# Patient Record
Sex: Female | Born: 1990
Health system: Southern US, Community
[De-identification: ages and names within clinical notes are randomized; demographics above are authoritative.]

## PROBLEM LIST (undated history)

## (undated) HISTORY — PX: WISDOM TOOTH EXTRACTION: SHX21

---

## 2013-09-16 ENCOUNTER — Ambulatory Visit (INDEPENDENT_AMBULATORY_CARE_PROVIDER_SITE_OTHER): Payer: 59

## 2013-09-16 VITALS — BP 110/69 | HR 96 | Resp 16 | Ht 66.0 in | Wt 145.0 lb

## 2013-09-16 DIAGNOSIS — L600 Ingrowing nail: Secondary | ICD-10-CM

## 2013-09-16 DIAGNOSIS — L03039 Cellulitis of unspecified toe: Secondary | ICD-10-CM

## 2013-09-16 MED ORDER — CEPHALEXIN 500 MG PO CAPS: 500.0000 mg | ORAL_CAPSULE | Freq: Three times a day (TID) | ORAL | Status: DC

## 2013-09-16 NOTE — Patient Instructions (Addendum)

## 2013-09-16 NOTE — Progress Notes (Signed)
   Subjective:    Patient ID: Katie Craig, female    DOB: Oct 25, 1990, 23 y.o.   MRN: [id number]  HPI Comments: "I have another ingrown toenail"  Patient states that her 1st right toe, both borders, has been tender for couple months. Not red or swollen. No treatment. Pressure aggravates it.     Review of Systems  All other systems reviewed and are negative.       Objective:   Physical Exam Neurovascular status is intact with palpable pulses. Epicritic and proprioceptive sensations intact and symmetrical bilateral. Patient is a previous medial border left great toe excised with good success just over a year ago now is experiencing pain of the medial lateral nail folds of the right great toe. Slight edema and erythema is noted no purulent discharge or drainage noted no secondary infection identified at this time. Patient has no complicating factors however does have requesting nail excision in a similar fashion for her right great toe. Patient has positive family history of people who have had ingrowing nails requiring removal.       Assessment & Plan:  Assessment ingrowing nail right great toe medial lateral border with early or pre-paronychia status. Plan at this time is patient skin injection of local anesthetic block 50-50 mixture of 2% Xylocaine plain and 0.5% Marcaine plain total of 3 cc. It occurs performed the medial lateral borders were excised followed by alcohol wash Silvadene cream and gauze dressing application. Patient is instructed in daily cleansing antibacterial soap and warm water Betadine or Epsom salts in warm water. Also issued Tylenol or Advil as needed for pain reappointed 2-3 weeks for followup for nail check.  Alvan Dameichard Lysha Schrade DPM

## 2013-10-04 ENCOUNTER — Ambulatory Visit (INDEPENDENT_AMBULATORY_CARE_PROVIDER_SITE_OTHER): Payer: 59

## 2013-10-04 VITALS — BP 103/58 | HR 107 | Resp 16

## 2013-10-04 DIAGNOSIS — Z09 Encounter for follow-up examination after completed treatment for conditions other than malignant neoplasm: Secondary | ICD-10-CM

## 2013-10-04 DIAGNOSIS — L600 Ingrowing nail: Secondary | ICD-10-CM

## 2013-10-04 NOTE — Patient Instructions (Addendum)
Long Term Care Instructions-Post Nail Surgery  You have had your ingrown toenail and root treated with a chemical.  This chemical causes a burn that will drain and ooze like a blister.  This can drain for 6-8 weeks or longer.  It is important to keep this area clean, covered, and follow the soaking instructions dispensed at the time of your surgery.  This area will eventually dry and form a scab.  Once the scab forms you no longer need to soak or apply a dressing.  If at any time you experience an increase in pain, redness, swelling, or drainage, you should contact the office as soon as possible.  The nail edges should I cut back however is been doing time contact us for possible followup

## 2013-10-04 NOTE — Progress Notes (Signed)
   Subjective:    Patient ID: Curley SpiceChristina Vigeant, female    DOB: Jan 18, 1991, 23 y.o.   MRN: [id number]  HPI Comments: "Its doing fine, no problems"  Follow up AP nail procedure 1st right both borders     Review of Systems no new changes or findings at this time     Objective:   Physical Exam Neurovascular status is intact pedal pulses palpable. Epicritic and proprioceptive sensations intact there is mild eschar no discharge drainage from the nail borders of the right great toe to slight erythema no ascending  lymphangitis no purulence no excessive pain. These are clean dry no pain no discomfort patient back to skiing within a couple days after the procedure .       Assessment & Plan:  Assessment good postop progress may continue with Neosporin and Band-Aid during daily or drainage night discharge to an as-needed basis for any future followup next  Alvan Dameichard Dakari Cregger DPM

## 2018-03-31 ENCOUNTER — Other Ambulatory Visit: Payer: Self-pay

## 2018-03-31 ENCOUNTER — Emergency Department (HOSPITAL_BASED_OUTPATIENT_CLINIC_OR_DEPARTMENT_OTHER)
Admission: EM | Admit: 2018-03-31 | Discharge: 2018-03-31 | Disposition: A | Discharge status: Home / Self Care | Payer: BLUE CROSS/BLUE SHIELD | Attending: Emergency Medicine | Admitting: Emergency Medicine

## 2018-03-31 ENCOUNTER — Encounter (HOSPITAL_BASED_OUTPATIENT_CLINIC_OR_DEPARTMENT_OTHER): Payer: Self-pay

## 2018-03-31 DIAGNOSIS — K5900 Constipation, unspecified: Secondary | ICD-10-CM | POA: Diagnosis not present

## 2018-03-31 MED ORDER — MAGNESIUM CITRATE PO SOLN: 1.0000 | Freq: Once | ORAL | 0 refills | Status: AC

## 2018-03-31 MED ORDER — DOCUSATE SODIUM 100 MG PO CAPS: 100.0000 mg | ORAL_CAPSULE | Freq: Two times a day (BID) | ORAL | 0 refills | Status: DC

## 2018-03-31 MED ORDER — POLYETHYLENE GLYCOL 3350 17 GM/SCOOP PO POWD: 17.0000 g | Freq: Two times a day (BID) | ORAL | 0 refills | Status: DC

## 2018-03-31 MED FILL — SM MAGNESIUM CITRATE 1.745: 1.745 | 1 days supply | Qty: 296 | Fill #0

## 2018-03-31 MED FILL — DOK 100 MG SOFTGEL: 100 | 50 days supply | Qty: 100 | Fill #0

## 2018-03-31 MED FILL — SM CLEARLAX POWDER: 14 days supply | Qty: 238 | Fill #0

## 2018-03-31 NOTE — ED Triage Notes (Signed)
C/o constipation x 1 week-NAD-steady gait

## 2018-03-31 NOTE — Discharge Instructions (Signed)
Please read and follow all provided instructions.  Your diagnoses today include:  1. Constipation, unspecified constipation type     Tests performed today include:  Vital signs. See below for your results today.   Medications prescribed:   Miralax - laxative  This medication can be found over-the-counter.    Magnesium citrate - laxative   Colace - stool softener  This medication can be found over-the-counter.   Continue colace for 2 weeks after your stools return to normal to prevent constipation.   Take any medications only as directed on prescription or on packaging.   Home care instructions:  Follow any educational materials contained in this packet.  Follow-up instructions: Please follow-up with your primary care provider in the next week for further evaluation of your symptoms.   Return instructions:   Please return to the Emergency Department if you experience worsening symptoms.   Please return if you have worsening abdominal pain, swelling of your abdomen, persistent vomiting, blood in your stool or vomit, or fever.   Please return if you have any other emergent concerns.  Additional Information:  Your vital signs today were: BP 131/87 (BP Location: Left Arm)    Pulse 68    Temp 98.1 F (36.7 C) (Oral)    Resp 18    Ht 5\' 6"  (1.676 m)    Wt 64 kg (141 lb)    LMP 03/27/2018    SpO2 100%    BMI 22.76 kg/m  If your blood pressure (BP) was elevated above 135/85 this visit, please have this repeated by your doctor within one month. --------------

## 2018-03-31 NOTE — ED Provider Notes (Signed)
[facility] EMERGENCY DEPARTMENT Provider Note   CSN: [id number] Arrival date & time: 03/31/18  1318     History   Chief Complaint Chief Complaint  Patient presents with  . Constipation    HPI Katie Craig is a 27 y.o. female.  Patient presents with complaint of constipation over the past 2 weeks.  She does not have a history of constipation.  She does not have significant pain but has a full, bloated feeling.  She had nausea after exercising yesterday.  She has had some very small hard stools.  She denies any rectal pain or bleeding in her stool.  No previous abdominal surgeries.  She states that she drinks a lot of water.  She has treated herself with probiotics and then tried an unknown, 'natural', over-the-counter laxative.  She tried one glycerin suppository without any improvement.  She has no other complaints.  No fevers, urinary symptoms. The onset of this condition was gradual. The course is constant. Aggravating factors: none. Alleviating factors: none.       History reviewed. No pertinent past medical history.  There are no active problems to display for this patient.   Past Surgical History:  Procedure Laterality Date  . WISDOM TOOTH EXTRACTION       OB History   None      Home Medications    Prior to Admission medications   Medication Sig Start Date End Date Taking? Authorizing Provider  docusate sodium (COLACE) 100 MG capsule Take 1 capsule (100 mg total) by mouth every 12 (twelve) hours. 03/31/18   Renne Crigler, PA-C  magnesium citrate SOLN Take 296 mLs (1 Bottle total) by mouth once for 1 dose. 03/31/18 03/31/18  Renne Crigler, PA-C  polyethylene glycol powder (GLYCOLAX/MIRALAX) powder Take 17 g by mouth 2 (two) times daily. 03/31/18   Renne Crigler, PA-C    Family History No family history on file.  Social History Social History   Tobacco Use  . Smoking status: Never Smoker  . Smokeless tobacco: Never Used  Substance Use Topics    . Alcohol use: Not Currently  . Drug use: Never     Allergies   Patient has no known allergies.   Review of Systems Review of Systems  Constitutional: Negative for fever.  HENT: Negative for rhinorrhea and sore throat.   Eyes: Negative for redness.  Respiratory: Negative for cough.   Cardiovascular: Negative for chest pain.  Gastrointestinal: Positive for abdominal distention and nausea. Negative for abdominal pain, blood in stool, diarrhea, rectal pain and vomiting.  Genitourinary: Negative for dysuria.  Musculoskeletal: Negative for myalgias.  Skin: Negative for rash.  Neurological: Negative for headaches.     Physical Exam Updated Vital Signs BP 131/87 (BP Location: Left Arm)   Pulse 68   Temp 98.1 F (36.7 C) (Oral)   Resp 18   Ht 5\' 6"  (1.676 m)   Wt 64 kg (141 lb)   LMP 03/27/2018   SpO2 100%   BMI 22.76 kg/m   Physical Exam  Constitutional: She appears well-developed and well-nourished.  HENT:  Head: Normocephalic and atraumatic.  Eyes: Conjunctivae are normal. Right eye exhibits no discharge. Left eye exhibits no discharge.  Neck: Normal range of motion. Neck supple.  Cardiovascular: Normal rate, regular rhythm and normal heart sounds.  Pulmonary/Chest: Effort normal and breath sounds normal.  Abdominal: Soft. There is no tenderness. There is no rebound and no guarding.  Neurological: She is alert.  Skin: Skin is warm and dry.  Psychiatric: She has a normal mood and affect.  Nursing note and vitals reviewed.    ED Treatments / Results  Labs (all labs ordered are listed, but only abnormal results are displayed) Labs Reviewed - No data to display  EKG None  Radiology No results found.  Procedures Procedures (including critical care time)  Medications Ordered in ED Medications - No data to display   Initial Impression / Assessment and Plan / ED Course  I have reviewed the triage vital signs and the nursing notes.  Pertinent labs &  imaging results that were available during my care of the patient were reviewed by me and considered in my medical decision making (see chart for details).     Patient seen and examined.  Patient with essentially normal exam.  Abdomen is soft and nontender.  Patient will be started on twice daily MiraLAX and will be given 1 dose of magnesium citrate.  She is encouraged to continue the MiraLAX until she is having normal bowel movements and then switch to Colace for 2 weeks.  Vital signs reviewed and are as follows: BP 131/87 (BP Location: Left Arm)   Pulse 68   Temp 98.1 F (36.7 C) (Oral)   Resp 18   Ht 5\' 6"  (1.676 m)   Wt 64 kg (141 lb)   LMP 03/27/2018   SpO2 100%   BMI 22.76 kg/m   The patient was urged to return to the Emergency Department immediately with worsening of current symptoms, worsening abdominal pain, persistent vomiting, blood noted in stools, fever, or any other concerns. The patient verbalized understanding.    Final Clinical Impressions(s) / ED Diagnoses   Final diagnoses:  Constipation, unspecified constipation type   Patient with uncomplicated constipation.  She does not have any abdominal pain or signs of infection.  No rectal pain to suggest impaction.  Will treat symptomatically.  GI referral given in case she continues to have symptoms for more than 1 month.  ED Discharge Orders        Ordered    polyethylene glycol powder (GLYCOLAX/MIRALAX) powder  2 times daily     03/31/18 1344    magnesium citrate SOLN   Once     03/31/18 1344    docusate sodium (COLACE) 100 MG capsule  Every 12 hours     03/31/18 1344       Renne CriglerGeiple, [place], PA-C 03/31/18 1350    Raeford RazorKohut, Stephen, MD 04/01/18 1140

## 2018-03-31 NOTE — ED Notes (Signed)
Started with probiotics  And then supp and laxatives has not helped x 2 weeks , has had very small bms but nothing that should be coming out for what she has been eating, felt nauseous yesterday feels full

## 2018-05-04 ENCOUNTER — Other Ambulatory Visit: Payer: Self-pay | Admitting: Family Medicine

## 2018-05-04 DIAGNOSIS — R140 Abdominal distension (gaseous): Secondary | ICD-10-CM

## 2018-05-06 ENCOUNTER — Other Ambulatory Visit: Payer: Self-pay | Admitting: Family Medicine

## 2018-05-06 ENCOUNTER — Ambulatory Visit
Admission: RE | Admit: 2018-05-06 | Discharge: 2018-05-06 | Disposition: A | Discharge status: Home / Self Care | Payer: BLUE CROSS/BLUE SHIELD | Source: Ambulatory Visit | Attending: Family Medicine | Admitting: Family Medicine

## 2018-05-06 DIAGNOSIS — R140 Abdominal distension (gaseous): Secondary | ICD-10-CM

## 2018-05-06 DIAGNOSIS — K5900 Constipation, unspecified: Secondary | ICD-10-CM

## 2018-05-11 ENCOUNTER — Ambulatory Visit
Admission: RE | Admit: 2018-05-11 | Discharge: 2018-05-11 | Disposition: A | Discharge status: Home / Self Care | Payer: BLUE CROSS/BLUE SHIELD | Source: Ambulatory Visit | Attending: Family Medicine | Admitting: Family Medicine

## 2018-05-11 DIAGNOSIS — R140 Abdominal distension (gaseous): Secondary | ICD-10-CM

## 2018-08-06 ENCOUNTER — Ambulatory Visit
Admission: RE | Admit: 2018-08-06 | Discharge: 2018-08-06 | Disposition: A | Discharge status: Home / Self Care | Payer: BLUE CROSS/BLUE SHIELD | Source: Ambulatory Visit | Attending: Gastroenterology | Admitting: Gastroenterology

## 2018-08-06 ENCOUNTER — Other Ambulatory Visit: Payer: Self-pay | Admitting: Gastroenterology

## 2018-08-06 DIAGNOSIS — K5900 Constipation, unspecified: Secondary | ICD-10-CM

## 2018-08-09 ENCOUNTER — Other Ambulatory Visit: Payer: Self-pay | Admitting: Gastroenterology

## 2018-08-09 ENCOUNTER — Ambulatory Visit
Admission: RE | Admit: 2018-08-09 | Discharge: 2018-08-09 | Disposition: A | Discharge status: Home / Self Care | Payer: BLUE CROSS/BLUE SHIELD | Source: Ambulatory Visit | Attending: Gastroenterology | Admitting: Gastroenterology

## 2018-08-09 DIAGNOSIS — K5900 Constipation, unspecified: Secondary | ICD-10-CM

## 2020-06-07 ENCOUNTER — Other Ambulatory Visit: Payer: Self-pay

## 2020-06-07 ENCOUNTER — Ambulatory Visit: Payer: Self-pay

## 2020-06-07 ENCOUNTER — Ambulatory Visit: Payer: BC Managed Care – PPO | Admitting: Family Medicine

## 2020-06-07 VITALS — BP 110/74 | HR 57 | Ht 66.0 in | Wt 145.6 lb

## 2020-06-07 DIAGNOSIS — G8929 Other chronic pain: Secondary | ICD-10-CM | POA: Diagnosis not present

## 2020-06-07 DIAGNOSIS — M25512 Pain in left shoulder: Secondary | ICD-10-CM

## 2020-06-07 DIAGNOSIS — K5904 Chronic idiopathic constipation: Secondary | ICD-10-CM | POA: Insufficient documentation

## 2020-06-07 DIAGNOSIS — M7552 Bursitis of left shoulder: Secondary | ICD-10-CM | POA: Insufficient documentation

## 2020-06-07 NOTE — Patient Instructions (Signed)
Thank you for coming in today.  Call or go to the ER if you develop a large red swollen joint with extreme pain or oozing puss.   I've referred you to Physical Therapy.  Let us know if you don't hear from them in one week.  Please use voltaren gel up to 4x daily for pain as needed.    Shoulder Impingement Syndrome Rehab Ask your health care provider which exercises are safe for you. Do exercises exactly as told by your health care provider and adjust them as directed. It is normal to feel mild stretching, pulling, tightness, or discomfort as you do these exercises. Stop right away if you feel sudden pain or your pain gets worse. Do not begin these exercises until told by your health care provider. Stretching and range-of-motion exercise This exercise warms up your muscles and joints and improves the movement and flexibility of your shoulder. This exercise also helps to relieve pain and stiffness. Passive horizontal adduction In passive adduction, you use your other hand to move the injured arm toward your body. The injured arm does not move on its own. In this movement, your arm is moved across your body in the horizontal plane (horizontal adduction). 1. Sit or stand and pull your left / right elbow across your chest, toward your other shoulder. Stop when you feel a gentle stretch in the back of your shoulder and upper arm. ? Keep your arm at shoulder height. ? Keep your arm as close to your body as you comfortably can. 2. Hold for __________ seconds. 3. Slowly return to the starting position. Repeat __________ times. Complete this exercise __________ times a day. Strengthening exercises These exercises build strength and endurance in your shoulder. Endurance is the ability to use your muscles for a long time, even after they get tired. External rotation, isometric This is an exercise in which you press the back of your wrist against a door frame without moving your shoulder joint  (isometric). 1. Stand or sit in a doorway, facing the door frame. 2. Bend your left / right elbow and place the back of your wrist against the door frame. Only the back of your wrist should be touching the frame. Keep your upper arm at your side. 3. Gently press your wrist against the door frame, as if you are trying to push your arm away from your abdomen (external rotation). Press as hard as you are able without pain. ? Avoid shrugging your shoulder while you press your wrist against the door frame. Keep your shoulder blade tucked down toward the middle of your back. 4. Hold for __________ seconds. 5. Slowly release the tension, and relax your muscles completely before you repeat the exercise. Repeat __________ times. Complete this exercise __________ times a day. Internal rotation, isometric This is an exercise in which you press your palm against a door frame without moving your shoulder joint (isometric). 1. Stand or sit in a doorway, facing the door frame. 2. Bend your left / right elbow and place the palm of your hand against the door frame. Only your palm should be touching the frame. Keep your upper arm at your side. 3. Gently press your hand against the door frame, as if you are trying to push your arm toward your abdomen (internal rotation). Press as hard as you are able without pain. ? Avoid shrugging your shoulder while you press your hand against the door frame. Keep your shoulder blade tucked down toward the middle of your  back. 4. Hold for __________ seconds. 5. Slowly release the tension, and relax your muscles completely before you repeat the exercise. Repeat __________ times. Complete this exercise __________ times a day. Scapular protraction, supine  1. Lie on your back on a firm surface (supine position). Hold a __________ weight in your left / right hand. 2. Raise your left / right arm straight into the air so your hand is directly above your shoulder joint. 3. Push the  weight into the air so your shoulder (scapula) lifts off the surface that you are lying on. The scapula will push up or forward (protraction). Do not move your head, neck, or back. 4. Hold for __________ seconds. 5. Slowly return to the starting position. Let your muscles relax completely before you repeat this exercise. Repeat __________ times. Complete this exercise __________ times a day. Scapular retraction  1. Sit in a stable chair without armrests, or stand up. 2. Secure an exercise band to a stable object in front of you so the band is at shoulder height. 3. Hold one end of the exercise band in each hand. Your palms should face down. 4. Squeeze your shoulder blades together (retraction) and move your elbows slightly behind you. Do not shrug your shoulders upward while you do this. 5. Hold for __________ seconds. 6. Slowly return to the starting position. Repeat __________ times. Complete this exercise __________ times a day. Shoulder extension  1. Sit in a stable chair without armrests, or stand up. 2. Secure an exercise band to a stable object in front of you so the band is above shoulder height. 3. Hold one end of the exercise band in each hand. 4. Straighten your elbows and lift your hands up to shoulder height. 5. Squeeze your shoulder blades together and pull your hands down to the sides of your thighs (extension). Stop when your hands are straight down by your sides. Do not let your hands go behind your body. 6. Hold for __________ seconds. 7. Slowly return to the starting position. Repeat __________ times. Complete this exercise __________ times a day. This information is not intended to replace advice given to you by your health care provider. Make sure you discuss any questions you have with your health care provider. Document Revised: 12/03/2018 Document Reviewed: 09/06/2018 Elsevier Patient Education  2020 [employer].

## 2020-06-07 NOTE — Progress Notes (Signed)
Subjective:    CC: B shoulder pain, L >R  I, Molly Weber, LAT, ATC, am serving as scribe for Dr. [name].  HPI: Pt is a 29 y/o female presenting w/ c/o L shoulder pain x 2 months w/ no known MOI. She locates her L shoulder pain to her L ant shoulder. She works at [employer] as a Engineer, production.  She reports having played roller derby for approximately 6 years sho had a lot of contact to her shoulders.  Pain is more severe at work and sometimes limits her ability to use her left arm normally at work when lifting heavy objects.  Radiating pain: yes into the L arm when her shoulder is particularly irritated L shoulder mechanical symptoms: yes, crepitus w/ rotation Aggravating factors: isometric bicep curl; overhead AROM; L shoulder extension Treatments tried: OTC anti-inflammatories; decreased her workouts  Pertinent review of Systems: No fevers or chills  Relevant historical information: History constipation seeing gastroenterology.   Objective:    Vitals:   06/07/20 0827  BP: 110/74  Pulse: (!) 57  SpO2: 100%   General: Well Developed, well nourished, and in no acute distress.   MSK: Left shoulder normal-appearing Normal motion pain with abduction. Intact strength abduction external/internal rotation pain with abduction present. Positive Hawkins and Neer's test.  Negative empty can test. Minimally positive pain anterior shoulder to resisted elbow flexion.  No pain to resisted supination. Pulses cap refill sensation intact distally.  C-spine normal-appearing nontender normal cervical motion negative Spurling's test.  Reflexes and strength intact distally.  Left wrist normal appearing negative tinnels test, negative phalens test.  Normal grip strength and sensation.  Lab and Radiology Results  Procedure: Real-time Ultrasound Guided Injection of left shoulder subacromial bursa Device: Philips Affiniti 50G Images permanently stored and available for review in  PACS Ultrasound examination of shoulder prior to injection reveals moderate subacromial bursitis with intact appearing rotator cuff tendons. Verbal informed consent obtained.  Discussed risks and benefits of procedure. Warned about infection bleeding damage to structures skin hypopigmentation and fat atrophy among others. Patient expresses understanding and agreement Time-out conducted.   Noted no overlying erythema, induration, or other signs of local infection.   Skin prepped in a sterile fashion.   Local anesthesia: Topical Ethyl chloride.   With sterile technique and under real time ultrasound guidance:  40 mg of Kenalog and 2 mL of Marcaine injected into subacromial bursa. Fluid seen entering the bursa.   Completed without difficulty   Pain immediately resolved suggesting accurate placement of the medication.   Advised to call if fevers/chills, erythema, induration, drainage, or persistent bleeding.   Images permanently stored and available for review in the ultrasound unit.  Impression: Technically successful ultrasound guided injection.    Impression and Recommendations:    Assessment and Plan: 29 y.o. female with left shoulder pain due to subacromial bursitis/impingement.  May be some rotator cuff tendinitis present as well.  Doubtful for significant tear.  Plan for injection and physical therapy trial as well as home exercise program.  Recheck back if not improving.  Discussed precautions patient expresses understanding and agreement.Marland Kitchen  PDMP not reviewed this encounter. Orders Placed This Encounter  Procedures  . [place] LIMITED JOINT SPACE STRUCTURES UP LEFT(NO LINKED CHARGES)    Order Specific Question:   Reason for Exam (SYMPTOM  OR DIAGNOSIS REQUIRED)    Answer:   L shoulder pain    Order Specific Question:   Preferred imaging location?    Answer:  [facility]  . Ambulatory referral to Physical Therapy    Referral Priority:   Routine    Referral Type:    Physical Medicine    Referral Reason:   Specialty Services Required    Requested Specialty:   Physical Therapy   No orders of the defined types were placed in this encounter.   Discussed warning signs or symptoms. Please see discharge instructions. Patient expresses understanding.   The above documentation has been reviewed and is accurate and complete [name], M.D.

## 2020-06-28 ENCOUNTER — Ambulatory Visit (INDEPENDENT_AMBULATORY_CARE_PROVIDER_SITE_OTHER): Payer: BC Managed Care – PPO | Admitting: Physical Therapy

## 2020-06-28 ENCOUNTER — Ambulatory Visit: Payer: BC Managed Care – PPO | Admitting: Physical Therapy

## 2020-06-28 ENCOUNTER — Encounter: Payer: Self-pay | Admitting: Physical Therapy

## 2020-06-28 ENCOUNTER — Other Ambulatory Visit: Payer: Self-pay

## 2020-06-28 DIAGNOSIS — M6281 Muscle weakness (generalized): Secondary | ICD-10-CM

## 2020-06-28 DIAGNOSIS — R293 Abnormal posture: Secondary | ICD-10-CM | POA: Diagnosis not present

## 2020-06-28 DIAGNOSIS — M25512 Pain in left shoulder: Secondary | ICD-10-CM | POA: Diagnosis not present

## 2020-06-28 NOTE — Therapy (Signed)
[facility] [address] [place], [place], [address] Phone: [phone]   Fax:  [phone]  Physical Therapy Evaluation  Patient Details  Name: Katie Craig MRN: [id number] Date of Birth: [birth year] Referring Provider (PT): Rodolph Bong, MD   Encounter Date: 06/28/2020   PT End of Session - 06/28/20 1418    Visit Number 1    Number of Visits 8    Date for PT Re-Evaluation 08/23/20    Authorization Type BCBS 30 visit limit    PT Start Time 1345    PT Stop Time 1415    PT Time Calculation (min) 30 min    Activity Tolerance Patient tolerated treatment well    Behavior During Therapy [facility] for tasks assessed/performed           History reviewed. No pertinent past medical history.  Past Surgical History:  Procedure Laterality Date  . WISDOM TOOTH EXTRACTION      There were no vitals filed for this visit.    Subjective Assessment - 06/28/20 1344    Subjective Pt is a 29 y/o female who presents to OPPT for Lt shoulder pain x 2-3 months.  She reports long standing hx of bil shoulder issues from playing roller derby, but new sudden onset of sharp Lt shoulder pain with overhead movements and lifting/holding heavier items.    Limitations Lifting;House hold activities    Patient Stated Goals improve pain, be active with less pain    Currently in Pain? Yes    Pain Score 3    up to 8/10; at best 1/10   Pain Location Shoulder    Pain Orientation Left    Pain Descriptors / Indicators Aching;Dull;Sharp;Pressure   catching, clicking   Pain Type Acute pain;Chronic pain    Pain Onset More than a month ago    Pain Frequency Constant    Aggravating Factors  lifting/reaching overhead    Pain Relieving Factors avoiding aggravating activities, less activity              [facility] PT Assessment - 06/28/20 1350      Assessment   Medical Diagnosis M25.512,G89.29 (ICD-10-CM) - Chronic left shoulder pain    Referring Provider (PT) Rodolph Bong, MD    Onset Date/Surgical Date --   2-3 months ago   Hand Dominance Right    Next MD Visit PRN - if no improvement    Prior Therapy n/a      Precautions   Precautions None      Restrictions   Weight Bearing Restrictions No      Balance Screen   Has the patient fallen in the past 6 months No    Has the patient had a decrease in activity level because of a fear of falling?  No    Is the patient reluctant to leave their home because of a fear of falling?  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#    Leisure running, skating, cooking / regular exercise: 3 days/wk (yoga, long walk, weight training classes)      Cognition   Overall Cognitive Status Within Functional Limits for tasks assessed  Observation/Other Assessments   Focus on Therapeutic Outcomes (FOTO)  58 (predicted 74)      Posture/Postural Control   Posture/Postural Control Postural limitations    Postural Limitations Rounded Shoulders;Forward head      ROM / Strength   AROM / PROM / Strength AROM;Strength      AROM   Overall AROM Comments bil shoulders WNL - mild pain with Lt shoulder abduction    AROM Assessment Site Shoulder    Right/Left Shoulder Right;Left      Strength   Strength Assessment Site Shoulder    Right/Left Shoulder Left    Left Shoulder Flexion 3+/5    Left Shoulder ABduction 4/5   with concordant pain   Left Shoulder Internal Rotation 5/5    Left Shoulder External Rotation 4/5      Palpation   Palpation comment no significant tenderness noted with palpation      Special Tests    Special Tests Rotator Cuff Impingement    Rotator Cuff Impingment tests Leanord Asal test      Hawkins-Kennedy test   Findings Negative     Side Left                      Objective measurements completed on examination: See above findings.       [facility] Adult PT Treatment/Exercise - 06/28/20 1350      Exercises   Exercises Other Exercises    Other Exercises  verbally reviewed and demonstrated HEP - pt verbalized understanding                  PT Education - 06/28/20 1418    Education Details HEP    Person(s) Educated Patient    Methods Explanation;Demonstration;Handout    Comprehension Verbalized understanding;Returned demonstration            PT Short Term Goals - 06/28/20 1422      PT SHORT TERM GOAL #1   Title independent with initial HEP    Status New    Target Date 07/26/20             PT Long Term Goals - 06/28/20 1422      PT LONG TERM GOAL #1   Title independent with final HEP    Status New    Target Date 08/23/20      PT LONG TERM GOAL #2   Title improve Lt shoulder strength to 5/5 for improved function    Status New    Target Date 08/23/20      PT LONG TERM GOAL #3   Title report pain < 4/10 with work activities for improved function and pain    Status New    Target Date 08/23/20      PT LONG TERM GOAL #4   Title FOTO score improved to 74 for improved function    Status New    Target Date 08/23/20                  Plan - 06/28/20 1418    Clinical Impression Statement Pt is a 29 y/o female who presents to OPPT for acute onset of Lt shoulder pain in setting of chronic Lt shoulder pain.  She has 2-3 month hx of sharp pains without known injury.  She demonstrates pain with resisted abduction and flexion and postural abnormalities with mild strength deficits.  Will benefit from PT to maximize function. Cannot rule out internal shoulder injury, but will plan to refer  back to MD if limited progress noted.  Will benefit from PT to address deficits listed.    Personal Factors and Comorbidities Past/Current Experience;Profession    Examination-Activity  Limitations Reach Overhead;Lift    Examination-Participation Restrictions Occupation;Community Activity    Stability/Clinical Decision Making Stable/Uncomplicated    Clinical Decision Making Low    Rehab Potential Good    PT Frequency 1x / week    PT Duration 8 weeks   up to 8 weeks   PT Treatment/Interventions ADLs/Self Care Home Management;Cryotherapy;Ultrasound;Moist Heat;Iontophoresis 4mg /ml Dexamethasone;Therapeutic activities;Therapeutic exercise;Patient/family education;Neuromuscular re-education;Manual techniques;Taping;Dry needling    PT Next Visit Plan review HEP, continue with generalized strengthening, scapular stability    PT Home Exercise Plan Access Code: [email or web address]           Patient will benefit from skilled therapeutic intervention in order to improve the following deficits and impairments:  Pain, Postural dysfunction, Decreased strength  Visit Diagnosis: Acute pain of left shoulder - Plan: PT plan of care cert/re-cert  Muscle weakness (generalized) - Plan: PT plan of care cert/re-cert  Abnormal posture - Plan: PT plan of care cert/re-cert     Problem List Patient Active Problem List   Diagnosis Date Noted  . Chronic idiopathic constipation 06/07/2020  . Subacromial bursitis of left shoulder joint 06/07/2020      06/09/2020, PT, DPT 06/28/20 2:25 PM    [facility] [address] [place], [place], [place] Phone: [phone]   Fax:  [phone]  Name: Katie Craig MRN: Curley Spice Date of Birth: 1990/12/22

## 2020-06-28 NOTE — Patient Instructions (Signed)
Access Code: [email or web address] URL: [email or web address] Date: 06/28/2020 Prepared by: Moshe Cipro  Exercises Standing Row with Anchored Resistance - 1 x daily - 7 x weekly - 3 sets - 10 reps Shoulder Extension with Resistance - 1 x daily - 7 x weekly - 3 sets - 10 reps Standing Shoulder Flexion to 90 Degrees with Dumbbells - 1 x daily - 7 x weekly - 3 sets - 10 reps Shoulder Abduction with Dumbbells - Thumbs Up - 1 x daily - 7 x weekly - 3 sets - 10 reps

## 2020-07-05 ENCOUNTER — Ambulatory Visit: Payer: BC Managed Care – PPO | Admitting: Physical Therapy

## 2020-07-05 ENCOUNTER — Other Ambulatory Visit: Payer: Self-pay

## 2020-07-05 ENCOUNTER — Encounter: Payer: Self-pay | Admitting: Physical Therapy

## 2020-07-05 DIAGNOSIS — R293 Abnormal posture: Secondary | ICD-10-CM | POA: Diagnosis not present

## 2020-07-05 DIAGNOSIS — M6281 Muscle weakness (generalized): Secondary | ICD-10-CM | POA: Diagnosis not present

## 2020-07-05 DIAGNOSIS — M25512 Pain in left shoulder: Secondary | ICD-10-CM

## 2020-07-05 NOTE — Therapy (Signed)
[facility] Physical Therapy [address] [place], [place], [address] Phone: [phone]   Fax:  [phone]  Physical Therapy Treatment  Patient Details  Name: Katie Craig MRN: [id number] Date of Birth: 18-Apr-1991 Referring Provider (PT): Rodolph Bong, MD   Encounter Date: 07/05/2020   PT End of Session - 07/05/20 1143    Visit Number 2    Number of Visits 8    Date for PT Re-Evaluation 08/23/20    Authorization Type BCBS 30 visit limit    PT Start Time 1100    PT Stop Time 1141    PT Time Calculation (min) 41 min    Activity Tolerance Patient tolerated treatment well    Behavior During Therapy [facility] for tasks assessed/performed           History reviewed. No pertinent past medical history.  Past Surgical History:  Procedure Laterality Date   WISDOM TOOTH EXTRACTION      There were no vitals filed for this visit.   Subjective Assessment - 07/05/20 1102    Subjective shoulder feels about the same - exercises going well; still having some pain with overhead activities    Patient Stated Goals improve pain, be active with less pain    Currently in Pain? No/denies                             [facility] Adult PT Treatment/Exercise - 07/05/20 1103      Exercises   Exercises Shoulder      Shoulder Exercises: Sidelying   External Rotation 20 reps;Weights;Both    External Rotation Weight (lbs) 2    ABduction Both;20 reps;Weights    ABduction Weight (lbs) 2      Shoulder Exercises: Standing   Flexion Both;20 reps;Weights    Shoulder Flexion Weight (lbs) 2    ABduction Both;20 reps;Weights    Shoulder ABduction Weight (lbs) 1 x 10 reps; 2 x 10 reps    Extension Both;20 reps;Theraband    Theraband Level (Shoulder Extension) Level 4 (Blue)    Row Both;20 reps;Theraband    Theraband Level (Shoulder Row) Level 4 (Blue)      Shoulder Exercises: ROM/Strengthening   Wall Pushups 20 reps    Wall Pushups Limitations partial  range-counter height    Proximal Shoulder Strengthening, Supine 2# circles CW/CCW x 20 reps; A-Z                  PT Education - 07/05/20 1142    Education Details HEP    Person(s) Educated Patient    Methods Explanation;Demonstration;Handout    Comprehension Verbalized understanding;Returned demonstration            PT Short Term Goals - 07/05/20 1143      PT SHORT TERM GOAL #1   Title independent with initial HEP    Status Achieved    Target Date 07/26/20             PT Long Term Goals - 07/05/20 1143      PT LONG TERM GOAL #1   Title independent with final HEP    Status On-going    Target Date 08/23/20      PT LONG TERM GOAL #2   Title improve Lt shoulder strength to 5/5 for improved function    Status On-going    Target Date 08/23/20      PT LONG TERM GOAL #3   Title report pain < 4/10 with  work activities for improved function and pain    Status On-going    Target Date 08/23/20      PT LONG TERM GOAL #4   Title FOTO score improved to 74 for improved function    Status On-going    Target Date 08/23/20                 Plan - 07/05/20 1143    Clinical Impression Statement Pt reports compliance with HEP and demostrated good knowledge of HEP today in the clinic.  Added additional strengthening exercises without increase in pain today.  Will continue to benefit from PT to maximize function and decrease pain.    Personal Factors and Comorbidities Past/Current Experience;Profession    Examination-Activity Limitations Reach Overhead;Lift    Examination-Participation Restrictions Occupation;Community Activity    Stability/Clinical Decision Making Stable/Uncomplicated    Rehab Potential Good    PT Frequency 1x / week    PT Duration 8 weeks   up to 8 weeks   PT Treatment/Interventions ADLs/Self Care Home Management;Cryotherapy;Ultrasound;Moist Heat;Iontophoresis 4mg /ml Dexamethasone;Therapeutic activities;Therapeutic exercise;Patient/family  education;Neuromuscular re-education;Manual techniques;Taping;Dry needling    PT Next Visit Plan review updated HEP, continue with generalized strengthening, scapular stability - get into weight bearing if able    PT Home Exercise Plan Access Code: [email or web address]           Patient will benefit from skilled therapeutic intervention in order to improve the following deficits and impairments:  Pain, Postural dysfunction, Decreased strength  Visit Diagnosis: Acute pain of left shoulder  Muscle weakness (generalized)  Abnormal posture     Problem List Patient Active Problem List   Diagnosis Date Noted   Chronic idiopathic constipation 06/07/2020   Subacromial bursitis of left shoulder joint 06/07/2020      06/09/2020, PT, DPT 07/05/20 12:49 PM    [facility] Therapy [address] [place], [place], [place] Phone: [phone]   Fax:  [phone]  Name: Katie Craig MRN: Curley Spice Date of Birth: 03-11-1991

## 2020-07-05 NOTE — Patient Instructions (Signed)
Access Code: [email or web address] URL: [email or web address] Date: 07/05/2020 Prepared by: Moshe Cipro  Exercises Standing Row with Anchored Resistance - 1 x daily - 7 x weekly - 3 sets - 10 reps Shoulder Extension with Resistance - 1 x daily - 7 x weekly - 3 sets - 10 reps Standing Shoulder Flexion to 90 Degrees with Dumbbells - 1 x daily - 7 x weekly - 3 sets - 10 reps Shoulder Abduction with Dumbbells - Thumbs Up - 1 x daily - 7 x weekly - 3 sets - 10 reps Sidelying Shoulder ER with Towel and Dumbbell - 1 x daily - 7 x weekly - 1 sets - 20 reps Sidelying Shoulder Abduction Palm Forward - 1 x daily - 7 x weekly - 1 sets - 20 reps Push-Up on Counter - 1 x daily - 7 x weekly - 1 sets - 20 reps

## 2020-07-26 ENCOUNTER — Ambulatory Visit: Payer: BC Managed Care – PPO | Admitting: Physical Therapy

## 2020-07-26 ENCOUNTER — Other Ambulatory Visit: Payer: Self-pay

## 2020-07-26 ENCOUNTER — Encounter: Payer: Self-pay | Admitting: Physical Therapy

## 2020-07-26 DIAGNOSIS — R293 Abnormal posture: Secondary | ICD-10-CM

## 2020-07-26 DIAGNOSIS — M6281 Muscle weakness (generalized): Secondary | ICD-10-CM | POA: Diagnosis not present

## 2020-07-26 DIAGNOSIS — M25512 Pain in left shoulder: Secondary | ICD-10-CM | POA: Diagnosis not present

## 2020-07-26 NOTE — Therapy (Addendum)
[facility] Therapy [address] [place], [place], [address] Phone: [phone]   Fax:  [phone]  Physical Therapy Treatment/Discharge Summary  Patient Details  Name: Katie Craig MRN: [id number] Date of Birth: [birth year] Referring Provider (PT): Gregor Hams, MD   Encounter Date: 07/26/2020   PT End of Session - 07/26/20 1330    Visit Number 3    Number of Visits 8    Date for PT Re-Evaluation 08/23/20    Authorization Type BCBS 30 visit limit    PT Start Time 1300    PT Stop Time 1330    PT Time Calculation (min) 30 min    Activity Tolerance Patient tolerated treatment well    Behavior During Therapy [facility] for tasks assessed/performed           History reviewed. No pertinent past medical history.  Past Surgical History:  Procedure Laterality Date  . WISDOM TOOTH EXTRACTION      There were no vitals filed for this visit.   Subjective Assessment - 07/26/20 1257    Subjective shoulder(s) are both hurting - had busy 2 weeks at work because of lifting, and thinks she may have compensated    Patient Stated Goals improve pain, be active with less pain    Currently in Pain? Yes    Pain Score 3    up to 6-7/10   Pain Location Shoulder    Pain Orientation Left;Right    Pain Descriptors / Indicators Aching;Dull;Sharp;Pressure   catching, clicking   Pain Type Acute pain;Chronic pain    Pain Onset More than a month ago    Pain Frequency Constant    Aggravating Factors  lifting/reaching overhead    Pain Relieving Factors avoiding aggravating activities, less activity              [facility] PT Assessment - 07/26/20 1307      Assessment   Medical Diagnosis M25.512,G89.29 (ICD-10-CM) - Chronic left shoulder pain    Referring Provider (PT) Gregor Hams, MD      Strength   Right/Left Shoulder Right;Left    Right Shoulder Flexion 4/5    Right Shoulder ABduction 3+/5    Right Shoulder Internal Rotation 5/5    Right Shoulder External  Rotation 5/5    Left Shoulder Flexion 3+/5    Left Shoulder ABduction 3+/5    Left Shoulder Internal Rotation 5/5    Left Shoulder External Rotation 4/5      Special Tests    Special Tests Rotator Cuff Impingement    Rotator Cuff Impingment tests Michel Bickers test;Empty Can test;Full Can test;Speed's test;Painful Arc of Motion      Hawkins-Kennedy test   Findings Positive    Side --   bil     Empty Can test   Findings Positive    Side Right      Full Can test   Findings Negative      Speed's test   Findings Positive    Side Left      Painful Arc of Motion   Findings Positive    Side Right                         [facility] Adult PT Treatment/Exercise - 07/26/20 1302      Shoulder Exercises: ROM/Strengthening   UBE (Upper Arm Bike) L2.5 x 6 min (3' each direction)  PT Education - 07/26/20 1331    Education Details sleep hygiene, aerobic exercise and benefits on inflammation, sugar and inflammation    Person(s) Educated Patient    Methods Explanation;Handout    Comprehension Verbalized understanding            PT Short Term Goals - 07/05/20 1143      PT SHORT TERM GOAL #1   Title independent with initial HEP    Status Achieved    Target Date 07/26/20             PT Long Term Goals - 07/05/20 1143      PT LONG TERM GOAL #1   Title independent with final HEP    Status On-going    Target Date 08/23/20      PT LONG TERM GOAL #2   Title improve Lt shoulder strength to 5/5 for improved function    Status On-going    Target Date 08/23/20      PT LONG TERM GOAL #3   Title report pain < 4/10 with work activities for improved function and pain    Status On-going    Target Date 08/23/20      PT LONG TERM GOAL #4   Title FOTO score improved to 74 for improved function    Status On-going    Target Date 08/23/20                 Plan - 07/26/20 1332    Clinical Impression Statement Pt continues to be  compliant with exercises, but reports increased pain following busy work schedule due to Thanksgiving.  She has signs consistent with bil RTC impingement and RTC weakness, and recommend she return to Dr. Georgina Snell for additional recommendations.  Will continue to see as indicated.    Personal Factors and Comorbidities Past/Current Experience;Profession    Examination-Activity Limitations Reach Overhead;Lift    Examination-Participation Restrictions Occupation;Community Activity    Stability/Clinical Decision Making Stable/Uncomplicated    Rehab Potential Good    PT Frequency 1x / week    PT Duration 8 weeks   up to 8 weeks   PT Treatment/Interventions ADLs/Self Care Home Management;Cryotherapy;Ultrasound;Moist Heat;Iontophoresis 30m/ml Dexamethasone;Therapeutic activities;Therapeutic exercise;Patient/family education;Neuromuscular re-education;Manual techniques;Taping;Dry needling    PT Next Visit Plan see what Dr. CGeorgina Snellrecommends, continue with generalized strengthening, scapular stability - get into weight bearing if able    PT Home Exercise Plan Access Code: [email or web address] Pocket          Patient will benefit from skilled therapeutic intervention in order to improve the following deficits and impairments:  Pain, Postural dysfunction, Decreased strength  Visit Diagnosis: Acute pain of left shoulder  Muscle weakness (generalized)  Abnormal posture     Problem List Patient Active Problem List   Diagnosis Date Noted  . Chronic idiopathic constipation 06/07/2020  . Subacromial bursitis of left shoulder joint 06/07/2020     SLaureen Abrahams PT, DPT 07/26/20 1:34 PM    [facility] [address] [address]Phone: [phone]  Fax:  [phone] Name: Katie MckinneyMRN: [id number] of Birth: 9July 01, 1992    PHYSICAL THERAPY DISCHARGE SUMMARY  Visits from Start of Care: 3  Current functional level related to goals /  functional outcomes: See above - pt referred for MRI and ultimately surgical consult   Remaining deficits: See above   Education / Equipment: HEP  Plan: Patient agrees to discharge.  Patient goals were not met. Patient is being discharged  due to lack of progress.  ?????     Laureen Abrahams, PT, DPT 10/03/20 10:46 AM  [facility] [address] [place], [place], [address] Phone: [phone]   Fax:  [phone]

## 2020-08-01 NOTE — Progress Notes (Signed)
I, Katie Craig, LAT, ATC, am serving as scribe for Dr. [name].  Katie Craig is a 29 y.o. female who presents to [facility] at [facility] today for f/u of L ant shoulder pain.  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  She was last seen by Dr. Denyse Craig on 06/07/20 and had a L subacromial injection.  She was also referred to PT of which she has completed 3 sessions.  Since her last visit w/ Dr. Denyse Craig, pt reports that she is now having pain in B shoulders.  She has been doing her HEP.  She does feel that her L shoulder is slightly improved but notes that her R shoulder pain has been ongoing for about 6 weeks now and is now the most problematic.  She locates her R shoulder pain to her R anterior shoulder w/ radiating pain into her R biceps brachii.  Aggravating factors include attempts at scaption and aBd AROM and reaching.  Both shoulders have been painful for greater than 6 weeks.   Pertinent review of systems: No fevers or chills  Relevant historical information: Constipation   Exam:  BP 104/82 (BP Location: Left Arm, Patient Position: Sitting, Cuff Size: Normal)   Pulse 68   Ht 5\' 6"  (1.676 m)   Wt 144 lb (65.3 kg)   SpO2 99%   BMI 23.24 kg/m  General: Well Developed, well nourished, and in no acute distress.   MSK: C-spine normal-appearing normal motion. Right shoulder normal-appearing Nontender. Normal motion pain with abduction. Intact strength abduction external and internal rotation. Positive Hawkins and Neer's test. Positive empty can test. Negative Yergason's and speeds test.  Left shoulder normal-appearing Nontender. Normal motion pain with abduction. Intact strength abduction external and internal rotation. Positive Hawkins and Neer's test. Positive empty can test. Negative Yergason's and speeds test.  Pulses cap refill and sensation are intact distally.  Lab and Radiology Results  X-ray images  bilateral shoulders obtained today personally and independently interpreted  Right shoulder: No fractures or significant DJD.  Left shoulder: No fractures or significant DJD.  Await formal radiology review  Diagnostic Limited MSK Ultrasound of: Right shoulder Biceps tendon intact normal-appearing Subscapularis tendon normal. Supraspinatus tendon is intact without tear.  Moderate subacromial bursa thickness present. Infraspinatus tendon intact normal-appearing AC joint normal-appearing Impression: Subacromial bursitis  Diagnostic Limited MSK Ultrasound of: Left shoulder Biceps tendon intact normal-appearing Subscapularis tendon normal-appearing Supraspinatus tendon is intact without tear. Moderate subacromial bursa thickness present. Infraspinatus tendon intact normal. AC joint normal-appearing Impression: Subacromial bursitis    Assessment and Plan: 29 y.o. female with bilateral ongoing shoulder pain not improving with conventional physical therapy treatment and left subacromial injection.  Patient works a physically demanding heavy job as a 37.  I believe her job is primarily exacerbating her shoulder pain.  I am fearful that she may require surgery and since she is not improving we will proceed with advanced imaging of the shoulders to further characterize cause of her shoulder pain and for further treatment planning including either further PT, injection, or even surgery planning.  Additionally advised patient that her job may not be compatible with her shoulders that she should consider looking for other employment.  Recheck following MRI.  Additionally will start nitroglycerin patch protocol.   PDMP not reviewed this encounter. Orders Placed This Encounter  Procedures  . Engineer, production LIMITED JOINT SPACE STRUCTURES UP RIGHT(NO LINKED CHARGES)    Order  Specific Question:   Reason for Exam (SYMPTOM  OR DIAGNOSIS REQUIRED)    Answer:   R shoulder pain    Order Specific Question:    Preferred imaging location?    Answer:   [facility]  . DG Shoulder Right    Standing Status:   Future    Standing Expiration Date:   08/02/2021    Order Specific Question:   Reason for Exam (SYMPTOM  OR DIAGNOSIS REQUIRED)    Answer:   eval shoulder pain    Order Specific Question:   Is patient pregnant?    Answer:   No    Order Specific Question:   Preferred imaging location?    Answer:   [facility]  . DG Shoulder Left    Standing Status:   Future    Standing Expiration Date:   08/02/2021    Order Specific Question:   Reason for Exam (SYMPTOM  OR DIAGNOSIS REQUIRED)    Answer:   eval shoulder pain    Order Specific Question:   Is patient pregnant?    Answer:   No    Order Specific Question:   Preferred imaging location?    Answer:   [facility]  . MR SHOULDER LEFT WO CONTRAST    Standing Status:   Future    Standing Expiration Date:   08/02/2021    Order Specific Question:   What is the patient's sedation requirement?    Answer:   No Sedation    Order Specific Question:   Does the patient have a pacemaker or implanted devices?    Answer:   No    Order Specific Question:   Preferred imaging location?    Answer:   [facility] (table limit-350lbs)  . MR SHOULDER RIGHT WO CONTRAST    Standing Status:   Future    Standing Expiration Date:   08/02/2021    Order Specific Question:   What is the patient's sedation requirement?    Answer:   No Sedation    Order Specific Question:   Does the patient have a pacemaker or implanted devices?    Answer:   No    Order Specific Question:   Preferred imaging location?    Answer:   [facility] (table limit-350lbs)   Meds ordered this encounter  Medications  . nitroGLYCERIN (NITRODUR - DOSED IN MG/24 HR) 0.2 mg/hr patch    Sig: Apply 1/4 patch daily to tendon for tendonitis.    Dispense:  30 patch    Refill:  1     Discussed warning signs or symptoms. Please see discharge  instructions. Patient expresses understanding.   The above documentation has been reviewed and is accurate and complete [name], M.D.

## 2020-08-02 ENCOUNTER — Ambulatory Visit (INDEPENDENT_AMBULATORY_CARE_PROVIDER_SITE_OTHER): Payer: BC Managed Care – PPO

## 2020-08-02 ENCOUNTER — Encounter: Payer: Self-pay | Admitting: Family Medicine

## 2020-08-02 ENCOUNTER — Ambulatory Visit: Payer: BC Managed Care – PPO | Admitting: Family Medicine

## 2020-08-02 ENCOUNTER — Ambulatory Visit: Payer: Self-pay

## 2020-08-02 ENCOUNTER — Other Ambulatory Visit: Payer: Self-pay

## 2020-08-02 ENCOUNTER — Encounter: Payer: BC Managed Care – PPO | Admitting: Physical Therapy

## 2020-08-02 VITALS — BP 104/82 | HR 68 | Ht 66.0 in | Wt 144.0 lb

## 2020-08-02 DIAGNOSIS — M25511 Pain in right shoulder: Secondary | ICD-10-CM | POA: Diagnosis not present

## 2020-08-02 DIAGNOSIS — M7552 Bursitis of left shoulder: Secondary | ICD-10-CM

## 2020-08-02 DIAGNOSIS — M25512 Pain in left shoulder: Secondary | ICD-10-CM | POA: Diagnosis not present

## 2020-08-02 DIAGNOSIS — M7551 Bursitis of right shoulder: Secondary | ICD-10-CM

## 2020-08-02 MED ORDER — NITROGLYCERIN 0.2 MG/HR TD PT24: MEDICATED_PATCH | TRANSDERMAL | 1 refills | Status: AC

## 2020-08-02 NOTE — Patient Instructions (Addendum)
Thank you for coming in today.  Please get an Xray today before you leave  You should hear from MRI scheduling within 1 week. If you do not hear please let me know.   Recheck following MRI.   Nitroglycerin Protocol   Apply 1/4 nitroglycerin patch to affected area daily.  Change position of patch within the affected area every 24 hours.  You may experience a headache during the first 1-2 weeks of using the patch, these should subside.  If you experience headaches after beginning nitroglycerin patch treatment, you may take your preferred over the counter pain reliever.  Another side effect of the nitroglycerin patch is skin irritation or rash related to patch adhesive.  Please notify our office if you develop more severe headaches or rash, and stop the patch.  Tendon healing with nitroglycerin patch may require 12 to 24 weeks depending on the extent of injury.  Men should not use if taking Viagra, Cialis, or Levitra.   Do not use if you have migraines or rosacea.    If you need a work note let me know.

## 2020-08-03 ENCOUNTER — Encounter: Payer: Self-pay | Admitting: Physical Therapy

## 2020-08-03 NOTE — Addendum Note (Signed)
Addended by: Evon Slack on: 08/03/2020 03:19 PM   Modules accepted: Orders

## 2020-08-09 ENCOUNTER — Encounter: Payer: Self-pay | Admitting: Physical Therapy

## 2020-08-28 ENCOUNTER — Encounter: Payer: Self-pay | Admitting: Family Medicine

## 2020-08-29 NOTE — Progress Notes (Signed)
I, Katie Craig, LAT, ATC, am serving as scribe for Dr. Clementeen Craig.  Katie Craig is a 30 y.o. female who presents to [facility] at [facility] today for f/u of B shoulder pain and B shoulder MRI review.  She was last seen by Dr. Denyse Craig on 08/02/20 for f/u of L shoulder pain and noted pain in B shoulders w/ R shoulder being the most problematic at that point.  She has completed 3 PT sessions and has been doing her HEP.  She had a prior L subacromial injection on 06/07/20 w/ little relief noted.  Since her last visit w/ Dr. Denyse Craig, pt reports that she con't to have B shoulder pain and both are about the same.  On her R shoulder, R shoulder extension behind the body like when putting on a coat/jacket is the most painful and on the L shoulder, the most painful motion is L shoulder flexion w/ IR and elbow flexed to 90 deg.  She tried the nitroglycerin patches for 2 weeks but felt increased pain while using them so has d/c them.  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   Exam:  BP 102/70 (BP Location: Right Arm, Patient Position: Sitting, Cuff Size: Normal)   Pulse 83   Ht 5\' 6"  (1.676 m)   Wt 144 lb (65.3 kg)   SpO2 97%   BMI 23.24 kg/m  General: Well Developed, well nourished, and in no acute distress.   MSK: Right shoulder normal-appearing normal motion pain with abduction Left shoulder normal-appearing normal motion with abduction   Lab and Radiology Results  , MD - 08/27/2020  Formatting of this note might be different from the original.  TECHNIQUE:MRI SHOULDER RIGHT WO IV CONTRAST-- Multiplanar, multisequence MRI of the shoulder was performed.   Contrast: none.   INDICATION: Pain in right shoulder. Pain.   COMPARISON: None.    FINDINGS:    -  Acromioclavicular joint: Mild degenerative changes. Trace effusion. No malalignment.  - Acromion: Trace subacromial/subdeltoid bursal inflammation.  - Supraspinatus tendon: Mild tendinosis. Tiny concealed interstitial tear/delamination along the posterior fibers (series 10, image 12).  - Infraspinatus tendon: Intact.  - Subscapularis tendon: Intact.  - Teres minor tendon: Intact.  - Long head of the biceps: Intact and in anatomic position.  - Glenohumeral joint: No effusion or malalignment.  - Labrum: No discrete labral tear. However, evaluation is limited by lack of intra-articular fluid/contrast.  - Bones: No acute fracture, neoplasm, or avascular necrosis.  - Soft tissues: No abnormal masses or fluid collections.   Additional comments: None.    IMPRESSION:  1. Tendinosis and tiny concealed interstitial tear of the supraspinatus.  2. Mild acromioclavicular joint degenerative changes.   Electronically Signed by: 10/25/2020 Exam End: 08/23/20 4:54 PM   08/25/20, MD - 08/27/2020  Formatting of this note might be different from the original.  TECHNIQUE:MRI SHOULDER LEFT WO IV CONTRAST-- Multiplanar, multisequence MRI of the shoulder was performed.   Contrast: none.   INDICATION: Pain in left shoulder. Persistent shoulder pain. Painful.   COMPARISON: None.    FINDINGS:    - Acromioclavicular joint: No effusion or malalignment. Mild degenerative changes.  - Acromion: Trace subacromial/subdeltoid bursal inflammation.  - Supraspinatus tendon: Mild tendinosis.  - Infraspinatus tendon: Intact.  -  Subscapularis tendon: Tiny concealed interstitial tear/delamination along the superior fibers (series 10, image 5). No retraction or atrophy.  - Teres minor tendon: Intact.  - Long head of the biceps: Intact and in anatomic position.  - Glenohumeral joint: No effusion or malalignment.  - Labrum: No discrete labral tear. However, evaluation is limited by  lack of intra-articular fluid/contrast.  - Bones: No acute fracture, neoplasm, or avascular necrosis.  - Soft tissues: No abnormal masses or fluid collections.   Additional comments: None.    IMPRESSION:  1. Tiny concealed interstitial tear/delamination of the superior subscapularis.  2. Mild supraspinatus tendinosis.  3. Mild acromioclavicular joint degenerative changes.   Electronically Signed by: Katie Craig Exam End: 08/23/20 4:23 PM    I, Katie Craig, personally (independently) visualized and performed the interpretation of the images on a CD provided by the patient attached in this note.     Assessment and Plan: 30 y.o. female with bilateral shoulder pain.  Right shoulder due to rotator cuff tendinopathy as well as small supraspinatus tear.  My personal interpretation of MRI is concerning for impingement as well.    Left shoulder also shows similar rotator cuff tendinopathy, small tear, and concern for impingement.  She has had a trial of conservative management including physical therapy home exercise program and injection.  At this point I think it is worthwhile to have a surgical consultation.  She may be a candidate for subacromial decompression and distal clavicle excision.  Referral to surgery ordered today.  Fundamentally however her job I think is a large factor in her inability to recover from shoulder pain.  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  We discussed that it may be a good idea to find a different line of work.  She is considering this anyway.   PDMP not reviewed this encounter. Orders Placed This Encounter  Procedures  . [place] LIMITED JOINT SPACE STRUCTURES UP BILAT(NO LINKED CHARGES)    Order Specific Question:   Reason for Exam (SYMPTOM  OR DIAGNOSIS REQUIRED)    Answer:   B shoulder pain    Order Specific Question:   Preferred imaging location?    Answer:   Adult [facility]  . Ambulatory referral  to Orthopedic Surgery    Referral Priority:   Routine    Referral Type:   Surgical    Referral Reason:   Specialty Services Required    Requested Specialty:   Orthopedic Surgery    Number of Visits Requested:   1   No orders of the defined types were placed in this encounter.    Discussed warning signs or symptoms. Please see discharge instructions. Patient expresses understanding.   The above documentation has been reviewed and is accurate and complete Katie Craig, M.D.  Total encounter time 20 minutes including face-to-face time with the patient and, reviewing past medical record, and charting on the date of service.   Treatment plan options and MRI review

## 2020-08-30 ENCOUNTER — Ambulatory Visit: Payer: BC Managed Care – PPO | Admitting: Family Medicine

## 2020-08-30 ENCOUNTER — Encounter: Payer: Self-pay | Admitting: Family Medicine

## 2020-08-30 ENCOUNTER — Other Ambulatory Visit: Payer: Self-pay

## 2020-08-30 ENCOUNTER — Ambulatory Visit: Payer: Self-pay

## 2020-08-30 VITALS — BP 102/70 | HR 83 | Ht 66.0 in | Wt 144.0 lb

## 2020-08-30 DIAGNOSIS — M25512 Pain in left shoulder: Secondary | ICD-10-CM | POA: Diagnosis not present

## 2020-08-30 DIAGNOSIS — M25511 Pain in right shoulder: Secondary | ICD-10-CM

## 2020-08-30 NOTE — Patient Instructions (Signed)
Thank you for coming in today.  Plan for surgery opinion.  You should hear soon.   I also recommend thinking about a different line of work.

## 2020-09-06 ENCOUNTER — Ambulatory Visit: Payer: BC Managed Care – PPO | Admitting: Family Medicine

## 2020-09-13 ENCOUNTER — Encounter: Payer: Self-pay | Admitting: Orthopedic Surgery

## 2020-09-13 ENCOUNTER — Other Ambulatory Visit: Payer: Self-pay

## 2020-09-13 ENCOUNTER — Ambulatory Visit: Payer: BC Managed Care – PPO | Admitting: Orthopedic Surgery

## 2020-09-13 DIAGNOSIS — M67912 Unspecified disorder of synovium and tendon, left shoulder: Secondary | ICD-10-CM | POA: Diagnosis not present

## 2020-09-13 DIAGNOSIS — M67911 Unspecified disorder of synovium and tendon, right shoulder: Secondary | ICD-10-CM

## 2020-09-14 ENCOUNTER — Encounter: Payer: Self-pay | Admitting: Orthopedic Surgery

## 2020-09-14 NOTE — Progress Notes (Signed)
Office Visit Note   Patient: Katie Craig           Date of Birth: 17-Aug-1991           MRN: [id number] Visit Date: 09/13/2020 Requested by: Rodolph Bong, MD [address] [place],  [place] [address] PCP: Patient, No Pcp Per  Subjective: Chief Complaint  Patient presents with  . Right Shoulder - Pain  . Left Shoulder - Pain    HPI: Katie Craig is a 30 y.o. female who presents to the office complaining of bilateral shoulder pain.  Patient complains of worsening shoulder pain since October.  She has had several years of shoulder pain but nothing that was significantly life altering until October.  She denies any specific injury in October but her job does involve a lot of heavy lifting and repetitive movements involving the shoulders.  She works at [employer].  She has a history of playing roller [place] for 6 years but she stopped playing that in 2017.  No history of significant shoulder injury or dislocation.  No history of shoulder surgery.  She has seen Dr. [name] where she has tried ultrasound, physical therapy, left shoulder cortisone injection, nitroglycerin patch without any lasting relief.  Cortisone injection into her left shoulder gave her about 30% relief that lasted for 1 week.  She has had MRI scans on both shoulders that revealed interstitial supraspinatus tear of the right shoulder and interstitial tear of the superior subscapularis with possible tear of the supraspinatus of the left shoulder..                ROS: All systems reviewed are negative as they relate to the chief complaint within the history of present illness.  Patient denies fevers or chills.  Assessment & Plan: Visit Diagnoses:  1. Tendinopathy of left rotator cuff   2. Tendinopathy of right rotator cuff     Plan: Patient is a 30 year old female who presents complaint of bilateral shoulder pain.  She has had shoulder pain for several years but significantly worse since October and seems  to be progressively worsening.  No specific injury that she can recall.  She works at [employer] and is a active person in general, having played roller [place] for several years.  She wants to return to exercising but feels that her shoulders hold her back.  Pain in the right shoulder is worse with external rotation actively and the left shoulder bothers her with active forward flexion as well as subscapularis/supraspinatus resistance testing on exam today.  She does have interstitial tear of the supraspinatus of the right shoulder and superior subscapularis interstitial tear of the left shoulder with a possible supraspinatus tear identified on the MRI as well.  Discussed options available to patient.  She has failed conservative management in the form of physical therapy, injections, anti-inflammatories.  Discussed surgery with subacromial decompression as well as potential for need for rotator cuff repair as well.  Discussed the risks and benefits of the procedure as well as the recovery timeline.  After lengthy discussion, patient wishes to think over her options.  She will call the office if she decides she wants to proceed with surgery.  A copy of the MRI was kept in the office for potential surgical planning in the future.  Follow-Up Instructions: No follow-ups on file.   Orders:  No orders of the defined types were placed in this encounter.  No orders of the defined types were placed in  this encounter.     Procedures: No procedures performed   Clinical Data: No additional findings.  Objective: Vital Signs: There were no vitals taken for this visit.  Physical Exam:  Constitutional: Patient appears well-developed HEENT:  Head: Normocephalic Eyes:EOM are normal Neck: Normal range of motion Cardiovascular: Normal rate Pulmonary/chest: Effort normal Neurologic: Patient is alert Skin: Skin is warm Psychiatric: Patient has normal mood and affect  Ortho Exam: Ortho exam demonstrates left  shoulder with 90 degrees external rotation, 130 degrees abduction, 180 degrees forward flexion.  Right shoulder with 90 degrees external rotation, 120 degrees abduction, 175 degrees forward flexion.  Excellent 5/5 strength of bilateral supraspinatus, infraspinatus, subscapularis but she does have significant pain with active external rotation of the right shoulder and subscapularis/supraspinatus resistance testing of the left shoulder.  Pain is worse with passive forward flexion bilaterally.  Not much crepitus with passive motion of the right shoulder but much more crepitus felt with passive ranging of the left shoulder.  Specialty Comments:  No specialty comments available.  Imaging: No results found.   PMFS History: Patient Active Problem List   Diagnosis Date Noted  . Subacromial bursitis of right shoulder joint 08/02/2020  . Chronic idiopathic constipation 06/07/2020  . Subacromial bursitis of left shoulder joint 06/07/2020   No past medical history on file.  No family history on file.  Past Surgical History:  Procedure Laterality Date  . WISDOM TOOTH EXTRACTION     Social History   Occupational History  . Not on file  Tobacco Use  . Smoking status: Never Smoker  . Smokeless tobacco: Never Used  Substance and Sexual Activity  . Alcohol use: Not Currently  . Drug use: Never  . Sexual activity: Not on file

## 2020-09-17 ENCOUNTER — Encounter: Payer: Self-pay | Admitting: Orthopedic Surgery

## 2020-10-05 ENCOUNTER — Telehealth: Payer: Self-pay | Admitting: Orthopedic Surgery

## 2020-10-05 NOTE — Telephone Encounter (Signed)
Patient would like to discuss a surgery date for her rotator cuff tears.   Please provide surgery sheet.  Thanks.

## 2020-10-07 NOTE — Telephone Encounter (Signed)
Replying so Dr. August Saucer can see this.  Also, would be helpful if she brings in her MRI disk when she has procedure

## 2020-10-10 NOTE — Telephone Encounter (Signed)
Done not sure which is the more sxs side

## 2020-10-11 ENCOUNTER — Telehealth: Payer: Self-pay | Admitting: Orthopedic Surgery

## 2020-10-11 NOTE — Telephone Encounter (Signed)
Pl s see which side hurts most thx

## 2020-10-11 NOTE — Telephone Encounter (Signed)
Patient sent an email through my chart wanting to know how far out her shoulder surgery would be scheduled. She plans to take some time off, as she may be moving soon.  March 7th was offered but she states she needs to get some things in order first.  She will call or email me when she is ready to schedule.

## 2022-08-10 IMAGING — DX DG SHOULDER 2+V*R*
3 series · 3 of 3 positions shown · non-contrast
Comparison: None.

CLINICAL DATA: Shoulder pain

EXAM:
RIGHT SHOULDER - 2+ VIEW

[shoulder ap (1 of 2)]
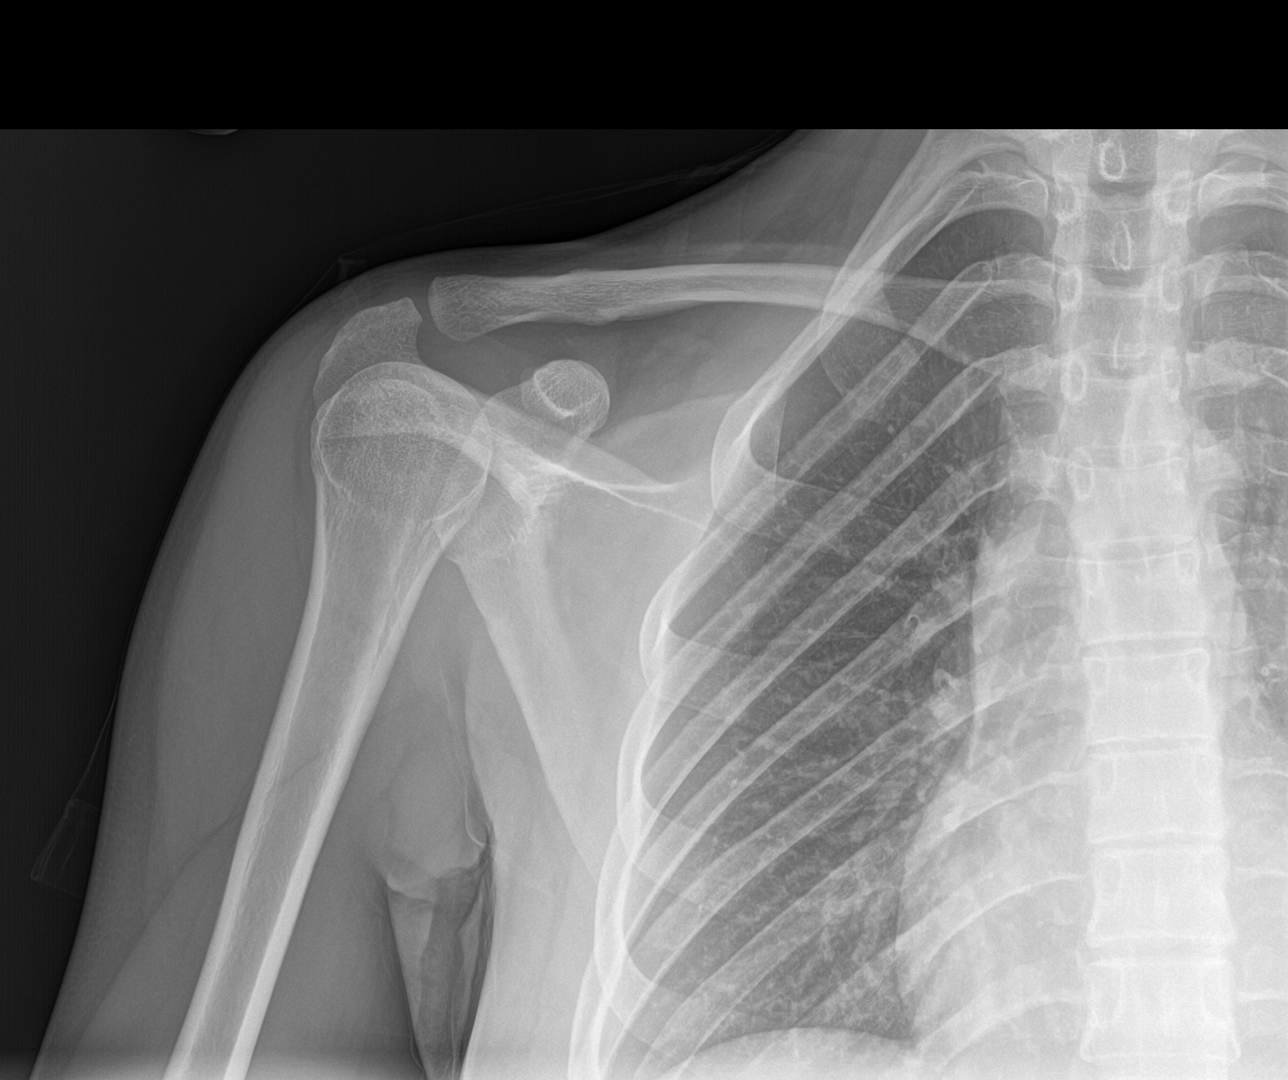

[shoulder ap (2 of 2)]
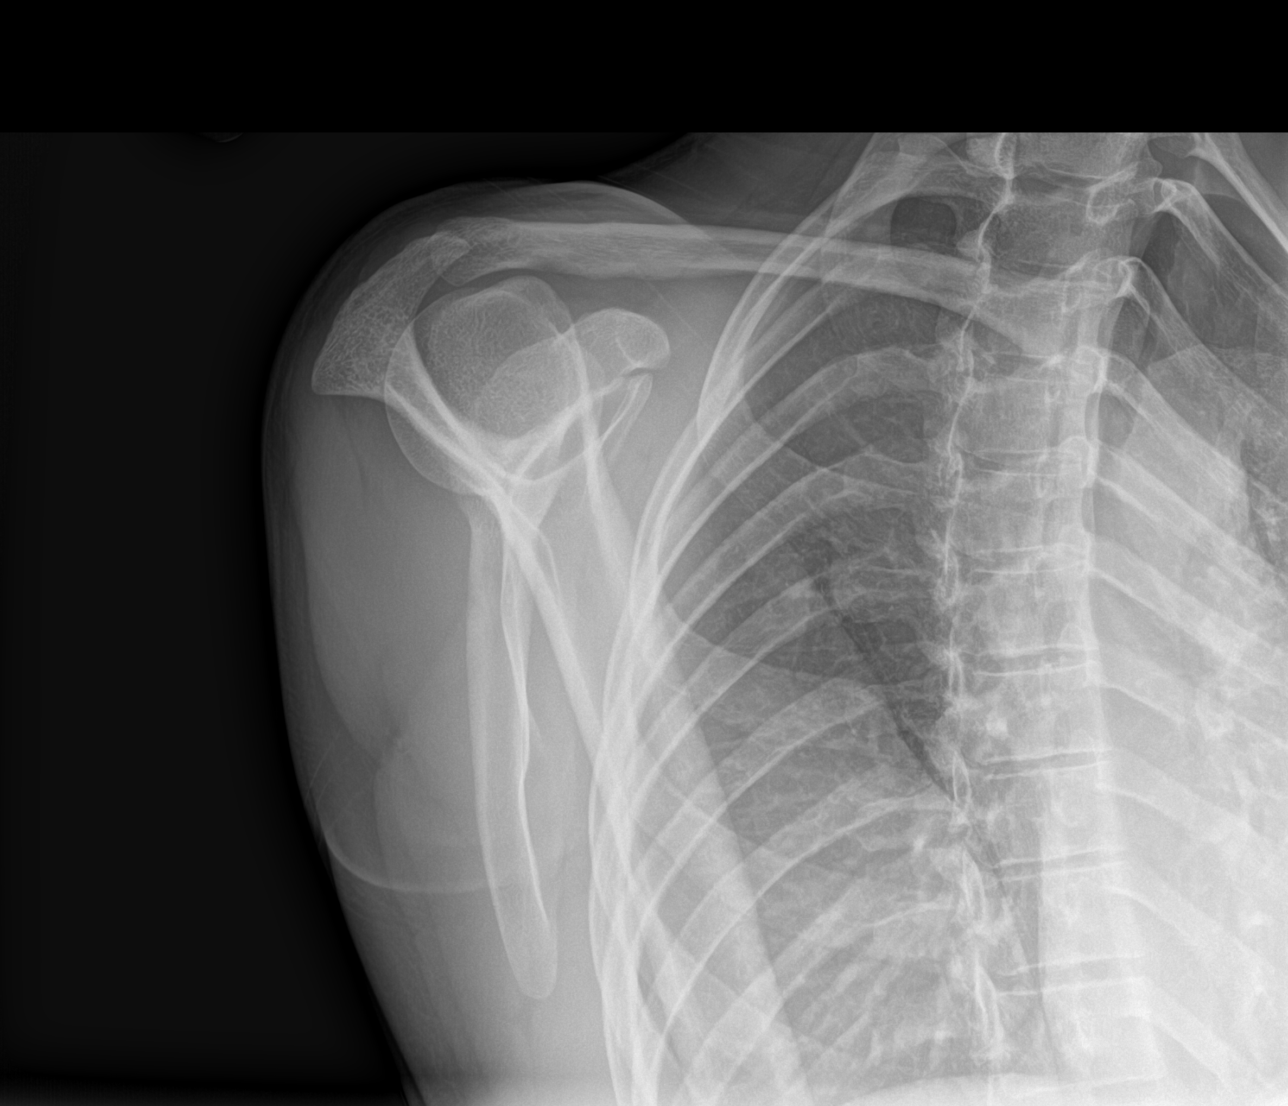

[shoulder axial]
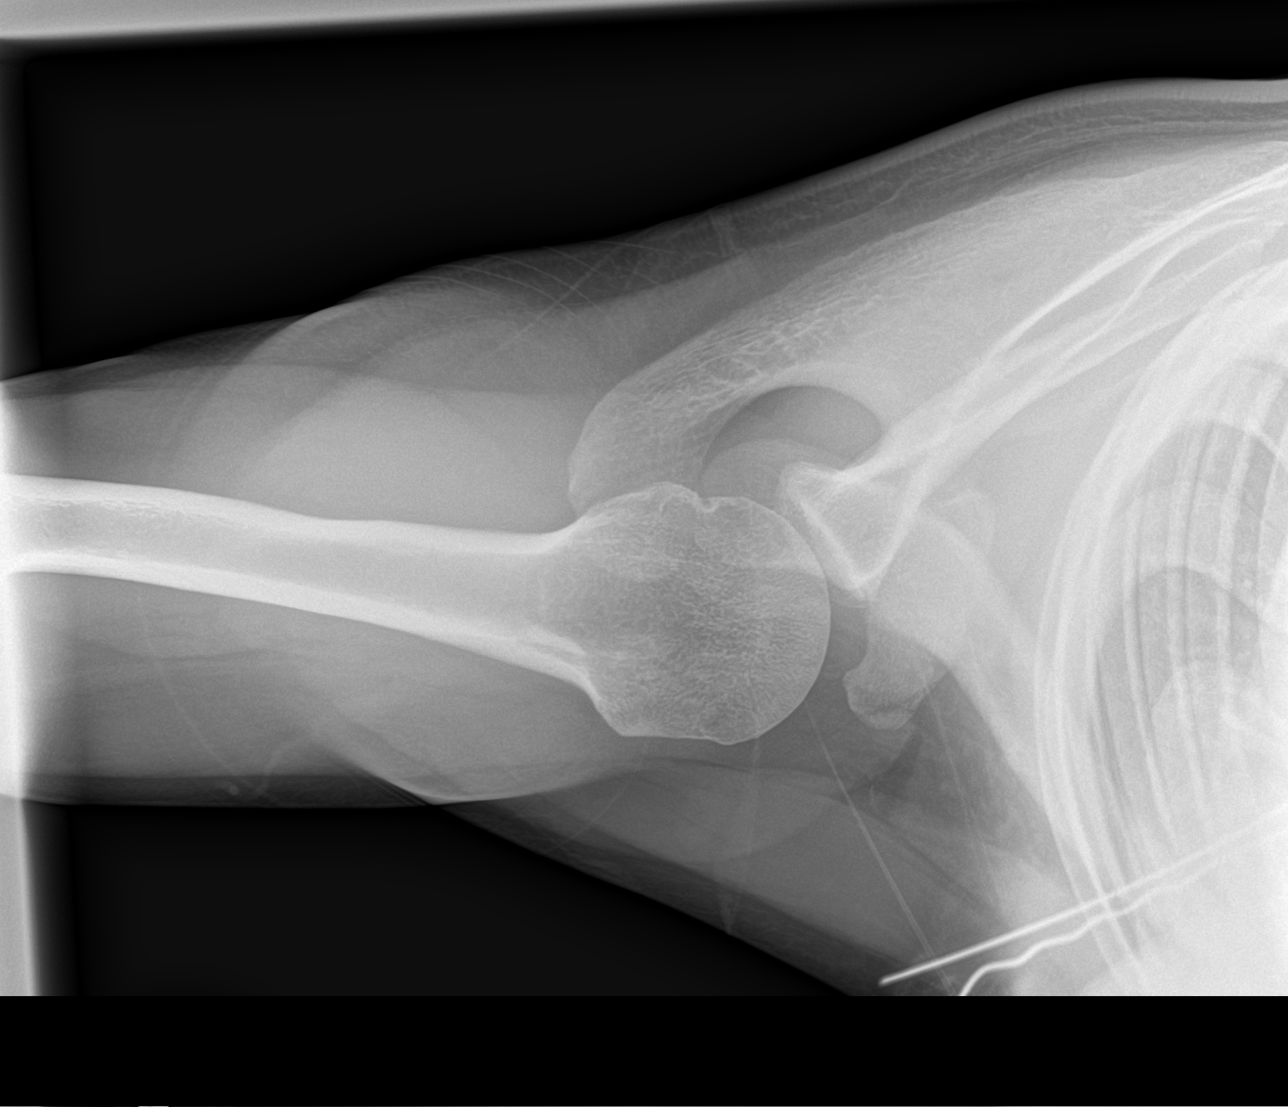

[3 of 3 positions shown; findings below may reference images not displayed]

FINDINGS: There is no evidence of fracture or dislocation. There is no
evidence of arthropathy or other focal bone abnormality. Soft
tissues are unremarkable.
IMPRESSION: Negative.

## 2022-08-10 IMAGING — DX DG SHOULDER 2+V*L*
3 series · 3 of 3 positions shown · non-contrast
Comparison: None.

CLINICAL DATA: Shoulder pain

EXAM:
LEFT SHOULDER - 2+ VIEW

[shoulder ap (1 of 2)]
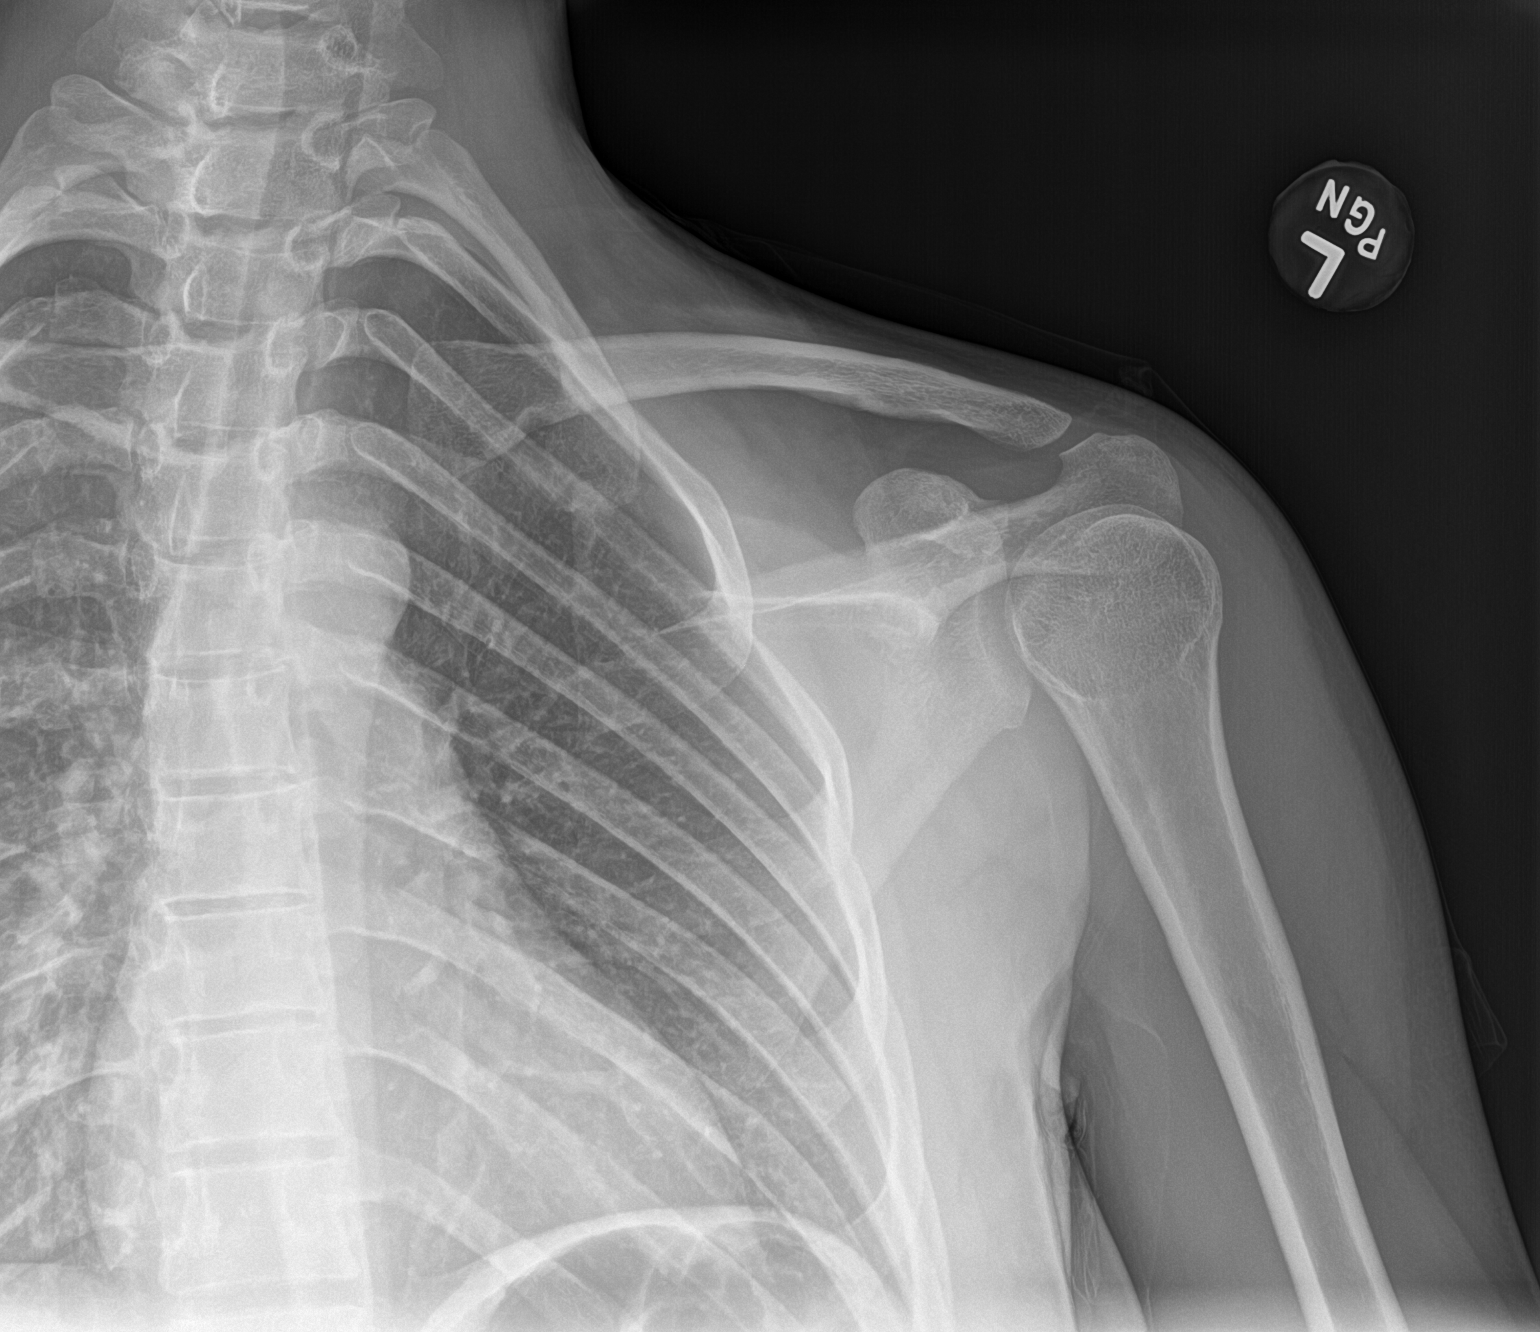

[shoulder ap (2 of 2)]
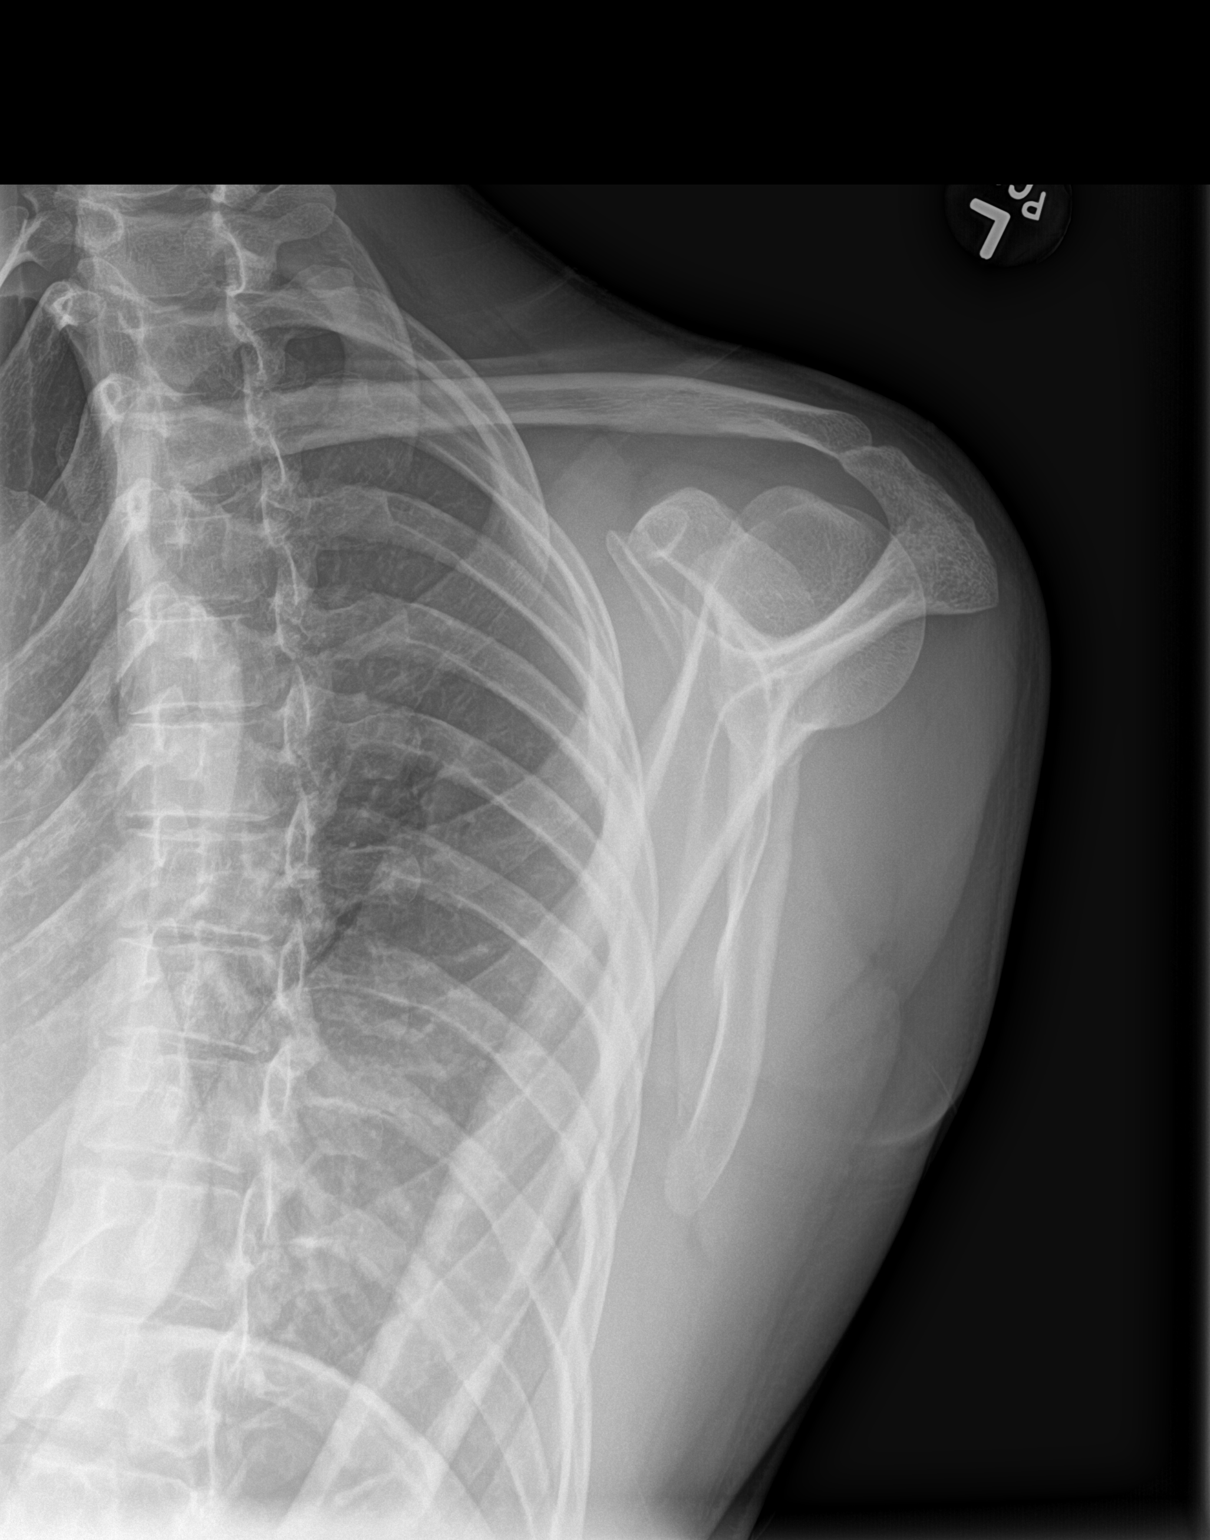

[shoulder axial]
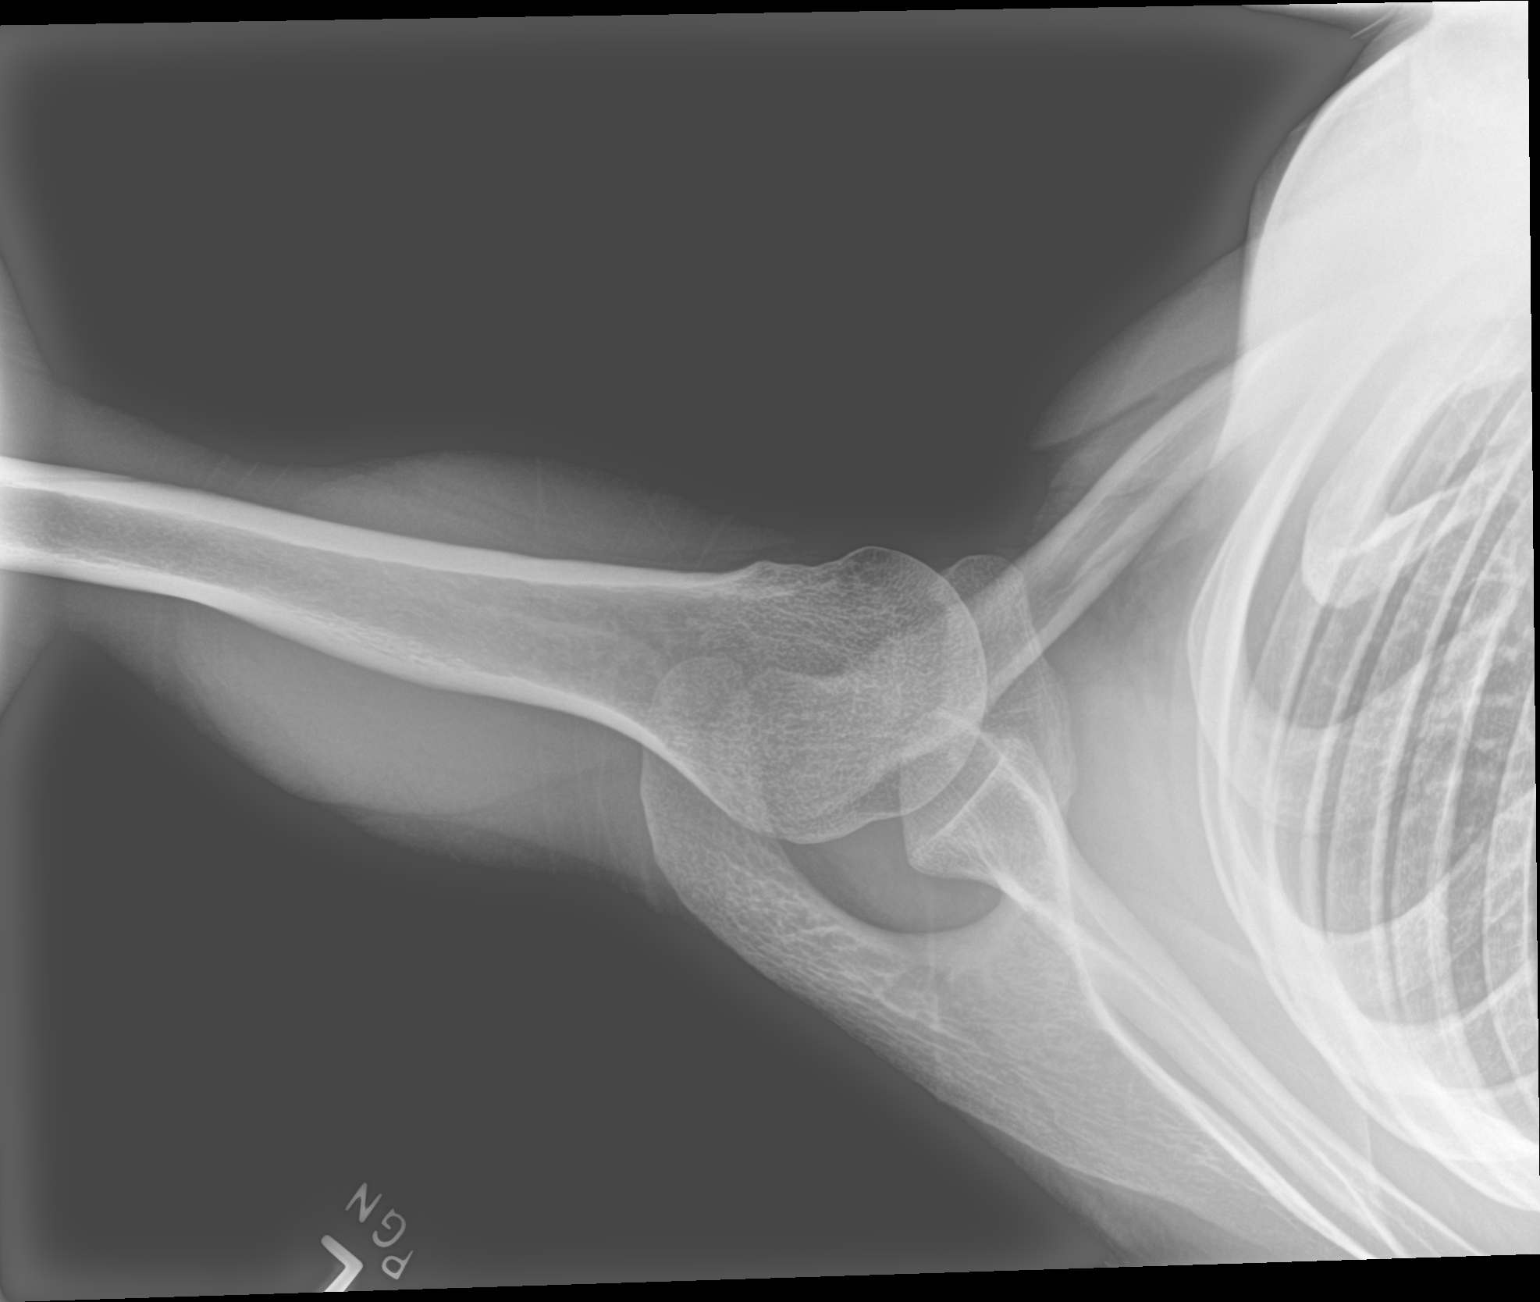

[3 of 3 positions shown; findings below may reference images not displayed]

FINDINGS: There is no evidence of fracture or dislocation. There is no
evidence of arthropathy or other focal bone abnormality. Soft
tissues are unremarkable.
IMPRESSION: Negative.
